# Patient Record
Sex: Female | Born: 1975 | Race: White | Hispanic: No | Marital: Married
Health system: Southern US, Community
[De-identification: ages and names within clinical notes are randomized; demographics above are authoritative.]

---

## 2005-01-27 ENCOUNTER — Emergency Department (HOSPITAL_COMMUNITY): Admission: EM | Admit: 2005-01-27 | Discharge: 2005-01-27 | Payer: Self-pay | Admitting: Emergency Medicine

## 2007-05-09 ENCOUNTER — Encounter: Admission: RE | Admit: 2007-05-09 | Discharge: 2007-05-09 | Payer: Self-pay | Admitting: Obstetrics and Gynecology

## 2007-07-21 ENCOUNTER — Inpatient Hospital Stay (HOSPITAL_COMMUNITY): Admission: AD | Admit: 2007-07-21 | Discharge: 2007-07-23 | Payer: Self-pay | Admitting: Obstetrics and Gynecology

## 2009-07-28 ENCOUNTER — Ambulatory Visit (HOSPITAL_COMMUNITY): Admission: RE | Admit: 2009-07-28 | Discharge: 2009-07-28 | Payer: Self-pay | Admitting: *Deleted

## 2009-08-25 ENCOUNTER — Ambulatory Visit (HOSPITAL_COMMUNITY): Admission: RE | Admit: 2009-08-25 | Discharge: 2009-08-25 | Payer: Self-pay | Admitting: Obstetrics and Gynecology

## 2009-09-22 ENCOUNTER — Ambulatory Visit (HOSPITAL_COMMUNITY): Admission: RE | Admit: 2009-09-22 | Discharge: 2009-09-22 | Payer: Self-pay | Admitting: Obstetrics and Gynecology

## 2009-12-02 ENCOUNTER — Inpatient Hospital Stay (HOSPITAL_COMMUNITY): Admission: AD | Admit: 2009-12-02 | Discharge: 2009-12-03 | Payer: Self-pay | Admitting: Obstetrics and Gynecology

## 2011-01-24 LAB — CBC
HCT: 32.8 % — ABNORMAL LOW (ref 36.0–46.0)
MCHC: 34.2 g/dL (ref 30.0–36.0)
MCV: 91.1 fL (ref 78.0–100.0)
Platelets: 186 10*3/uL (ref 150–400)
RBC: 3.6 MIL/uL — ABNORMAL LOW (ref 3.87–5.11)

## 2011-08-20 LAB — CBC
Hemoglobin: 12.4
MCV: 84.6
MCV: 84.8
WBC: 11.7 — ABNORMAL HIGH
WBC: 13.9 — ABNORMAL HIGH

## 2011-08-20 LAB — CCBB MATERNAL DONOR DRAW

## 2011-08-20 LAB — RAPID HIV SCREEN (WH-MAU): Rapid HIV Screen: NONREACTIVE

## 2016-03-22 DIAGNOSIS — Z1231 Encounter for screening mammogram for malignant neoplasm of breast: Secondary | ICD-10-CM | POA: Diagnosis not present

## 2016-04-30 DIAGNOSIS — G5601 Carpal tunnel syndrome, right upper limb: Secondary | ICD-10-CM | POA: Diagnosis not present

## 2016-05-26 DIAGNOSIS — G5601 Carpal tunnel syndrome, right upper limb: Secondary | ICD-10-CM | POA: Diagnosis not present

## 2016-06-08 DIAGNOSIS — G5601 Carpal tunnel syndrome, right upper limb: Secondary | ICD-10-CM | POA: Diagnosis not present

## 2016-06-08 DIAGNOSIS — Z4789 Encounter for other orthopedic aftercare: Secondary | ICD-10-CM | POA: Diagnosis not present

## 2016-06-08 DIAGNOSIS — Z9889 Other specified postprocedural states: Secondary | ICD-10-CM | POA: Diagnosis not present

## 2016-09-11 DIAGNOSIS — Z23 Encounter for immunization: Secondary | ICD-10-CM | POA: Diagnosis not present

## 2017-03-24 DIAGNOSIS — Z6825 Body mass index (BMI) 25.0-25.9, adult: Secondary | ICD-10-CM | POA: Diagnosis not present

## 2017-03-24 DIAGNOSIS — Z1231 Encounter for screening mammogram for malignant neoplasm of breast: Secondary | ICD-10-CM | POA: Diagnosis not present

## 2017-03-24 DIAGNOSIS — Z01419 Encounter for gynecological examination (general) (routine) without abnormal findings: Secondary | ICD-10-CM | POA: Diagnosis not present

## 2017-04-12 DIAGNOSIS — M65311 Trigger thumb, right thumb: Secondary | ICD-10-CM | POA: Diagnosis not present

## 2017-05-10 DIAGNOSIS — M65311 Trigger thumb, right thumb: Secondary | ICD-10-CM | POA: Diagnosis not present

## 2017-08-18 DIAGNOSIS — M65311 Trigger thumb, right thumb: Secondary | ICD-10-CM | POA: Diagnosis not present

## 2018-01-05 DIAGNOSIS — J069 Acute upper respiratory infection, unspecified: Secondary | ICD-10-CM | POA: Diagnosis not present

## 2018-08-14 DIAGNOSIS — Z23 Encounter for immunization: Secondary | ICD-10-CM | POA: Diagnosis not present

## 2018-08-17 DIAGNOSIS — Z01419 Encounter for gynecological examination (general) (routine) without abnormal findings: Secondary | ICD-10-CM | POA: Diagnosis not present

## 2018-08-17 DIAGNOSIS — R87810 Cervical high risk human papillomavirus (HPV) DNA test positive: Secondary | ICD-10-CM | POA: Diagnosis not present

## 2018-08-17 DIAGNOSIS — Z6825 Body mass index (BMI) 25.0-25.9, adult: Secondary | ICD-10-CM | POA: Diagnosis not present

## 2018-08-17 DIAGNOSIS — Z1231 Encounter for screening mammogram for malignant neoplasm of breast: Secondary | ICD-10-CM | POA: Diagnosis not present

## 2018-08-17 DIAGNOSIS — R87610 Atypical squamous cells of undetermined significance on cytologic smear of cervix (ASC-US): Secondary | ICD-10-CM | POA: Diagnosis not present

## 2018-12-10 DIAGNOSIS — R509 Fever, unspecified: Secondary | ICD-10-CM | POA: Diagnosis not present

## 2018-12-10 DIAGNOSIS — R05 Cough: Secondary | ICD-10-CM | POA: Diagnosis not present

## 2018-12-10 DIAGNOSIS — M791 Myalgia, unspecified site: Secondary | ICD-10-CM | POA: Diagnosis not present

## 2018-12-10 DIAGNOSIS — J101 Influenza due to other identified influenza virus with other respiratory manifestations: Secondary | ICD-10-CM | POA: Diagnosis not present

## 2019-08-07 DIAGNOSIS — F418 Other specified anxiety disorders: Secondary | ICD-10-CM | POA: Diagnosis not present

## 2019-08-08 DIAGNOSIS — Z23 Encounter for immunization: Secondary | ICD-10-CM | POA: Diagnosis not present

## 2019-10-10 DIAGNOSIS — Z3040 Encounter for surveillance of contraceptives, unspecified: Secondary | ICD-10-CM | POA: Diagnosis not present

## 2019-10-10 DIAGNOSIS — Z6825 Body mass index (BMI) 25.0-25.9, adult: Secondary | ICD-10-CM | POA: Diagnosis not present

## 2019-10-10 DIAGNOSIS — Z01419 Encounter for gynecological examination (general) (routine) without abnormal findings: Secondary | ICD-10-CM | POA: Diagnosis not present

## 2019-10-10 DIAGNOSIS — Z1231 Encounter for screening mammogram for malignant neoplasm of breast: Secondary | ICD-10-CM | POA: Diagnosis not present

## 2019-10-11 DIAGNOSIS — Z01419 Encounter for gynecological examination (general) (routine) without abnormal findings: Secondary | ICD-10-CM | POA: Diagnosis not present

## 2019-10-18 DIAGNOSIS — Z20828 Contact with and (suspected) exposure to other viral communicable diseases: Secondary | ICD-10-CM | POA: Diagnosis not present

## 2020-01-05 ENCOUNTER — Ambulatory Visit: Payer: Self-pay | Attending: Internal Medicine

## 2020-01-05 DIAGNOSIS — Z23 Encounter for immunization: Secondary | ICD-10-CM | POA: Insufficient documentation

## 2020-01-05 NOTE — Progress Notes (Signed)
   Covid-19 Vaccination [facility]  Name:  AREYA LEMMERMAN    MRN: [id number] DOB: [birth year]  01/05/2020  Ms. Wallman was observed post Covid-19 immunization for 15 minutes without incidence. She was provided with Vaccine Information Sheet and instruction to access the V-Safe system.   Ms. Loomis was instructed to call 911 with any severe reactions post vaccine: Marland Kitchen Difficulty breathing  . Swelling of your face and throat  . A fast heartbeat  . A bad rash all over your body  . Dizziness and weakness    Immunizations Administered    Name Date Dose VIS Date Route   Pfizer COVID-19 Vaccine 01/05/2020 11:12 AM 0.3 mL 10/19/2019 Intramuscular   Manufacturer: [employer], [employer]   Lot: [id number]   NDC: [address]-6

## 2020-01-30 ENCOUNTER — Ambulatory Visit: Payer: Self-pay | Attending: Internal Medicine

## 2020-01-30 DIAGNOSIS — Z23 Encounter for immunization: Secondary | ICD-10-CM

## 2020-01-30 NOTE — Progress Notes (Signed)
   Covid-19 Vaccination Clinic  Name:  Robin Beltran    MRN: [id number] DOB: May 03, 1976  01/30/2020  [name] was observed post Covid-19 immunization for 15 minutes without incident. She was provided with Vaccine Information Sheet and instruction to access the V-Safe system.   [name] was instructed to call 911 with any severe reactions post vaccine: Marland Kitchen Difficulty breathing  . Swelling of face and throat  . A fast heartbeat  . A bad rash all over body  . Dizziness and weakness   Immunizations Administered    Name Date Dose VIS Date Route   Pfizer COVID-19 Vaccine 01/30/2020 11:33 AM 0.3 mL 10/19/2019 Intramuscular   Manufacturer: [employer], [employer]   Lot: [id number]   NDC: [address]-0

## 2020-07-22 DIAGNOSIS — J029 Acute pharyngitis, unspecified: Secondary | ICD-10-CM | POA: Diagnosis not present

## 2020-08-07 DIAGNOSIS — Z23 Encounter for immunization: Secondary | ICD-10-CM | POA: Diagnosis not present

## 2020-11-26 DIAGNOSIS — Z1231 Encounter for screening mammogram for malignant neoplasm of breast: Secondary | ICD-10-CM | POA: Diagnosis not present

## 2020-12-03 DIAGNOSIS — Z01419 Encounter for gynecological examination (general) (routine) without abnormal findings: Secondary | ICD-10-CM | POA: Diagnosis not present

## 2020-12-03 DIAGNOSIS — Z6827 Body mass index (BMI) 27.0-27.9, adult: Secondary | ICD-10-CM | POA: Diagnosis not present

## 2021-08-19 ENCOUNTER — Other Ambulatory Visit: Payer: Self-pay | Admitting: Obstetrics and Gynecology

## 2021-08-19 DIAGNOSIS — Z803 Family history of malignant neoplasm of breast: Secondary | ICD-10-CM

## 2021-09-02 ENCOUNTER — Ambulatory Visit
Admission: RE | Admit: 2021-09-02 | Discharge: 2021-09-02 | Disposition: A | Discharge status: Home / Self Care | Payer: BC Managed Care – PPO | Source: Ambulatory Visit | Attending: Obstetrics and Gynecology | Admitting: Obstetrics and Gynecology

## 2021-09-02 DIAGNOSIS — N6489 Other specified disorders of breast: Secondary | ICD-10-CM | POA: Diagnosis not present

## 2021-09-02 DIAGNOSIS — Z803 Family history of malignant neoplasm of breast: Secondary | ICD-10-CM

## 2021-09-02 MED ORDER — GADOBUTROL 1 MMOL/ML IV SOLN
7.0000 mL | Freq: Once | INTRAVENOUS | Status: AC | PRN
  Administered 2021-09-02: 7 mL via INTRAVENOUS

## 2022-01-29 ENCOUNTER — Other Ambulatory Visit: Payer: Self-pay | Admitting: Obstetrics and Gynecology

## 2022-01-29 DIAGNOSIS — Z9189 Other specified personal risk factors, not elsewhere classified: Secondary | ICD-10-CM

## 2022-08-02 ENCOUNTER — Ambulatory Visit
Admission: RE | Admit: 2022-08-02 | Discharge: 2022-08-02 | Disposition: A | Discharge status: Home / Self Care | Payer: BC Managed Care – PPO | Source: Ambulatory Visit | Attending: Obstetrics and Gynecology | Admitting: Obstetrics and Gynecology

## 2022-08-02 DIAGNOSIS — Z9189 Other specified personal risk factors, not elsewhere classified: Secondary | ICD-10-CM

## 2022-08-02 DIAGNOSIS — Z1239 Encounter for other screening for malignant neoplasm of breast: Secondary | ICD-10-CM | POA: Diagnosis not present

## 2022-08-02 MED ORDER — GADOBUTROL 1 MMOL/ML IV SOLN
8.0000 mL | Freq: Once | INTRAVENOUS | Status: AC | PRN
  Administered 2022-08-02: 8 mL via INTRAVENOUS

## 2022-11-30 IMAGING — MR MR BREAST BILAT WO/W CM
8 of 12 series · 33 of 48 positions shown · IV contrast (gadavist)
Comparison: Previous exam(s).

CLINICAL DATA: High risk screening. Family history of breast
carcinoma, diagnosed in her sister at age 46.

LABS:  No labs drawn at time of imaging.
EXAM:
BILATERAL BREAST MRI WITH AND WITHOUT CONTRAST
TECHNIQUE: Multiplanar, multisequence MR images of both breasts were obtained
prior to and following the intravenous administration of 7 ml of
Gadavist

[Series 2: t2_tirm_tra ipat (a-p) · axial · 3.0mm · 0.70mm/px · 1 of 55 slices shown]
[im 1/55]
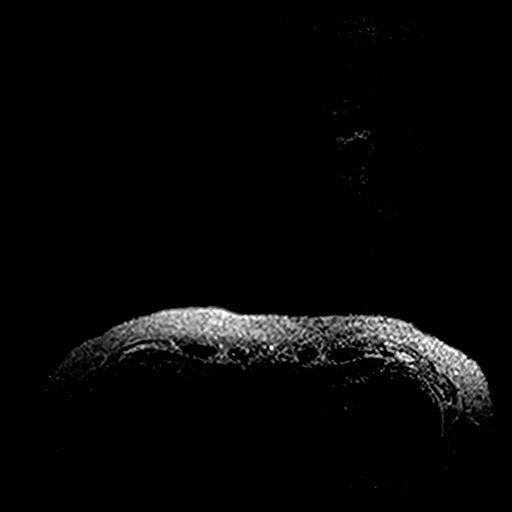

[Series 3: fl3d pre-cm no · axial · non-contrast · 1.2mm · 0.89mm/px · z∈[-60,+112]mm · 5 of 144 slices shown]
[im 1/144]
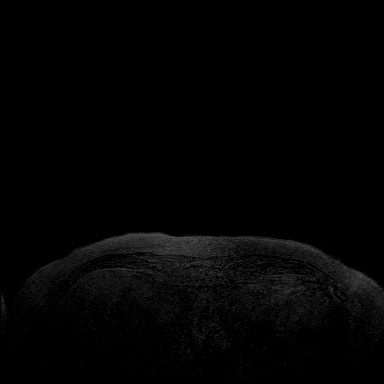
[im 36/144]
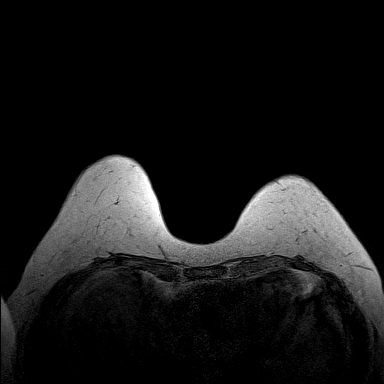
[im 72/144]
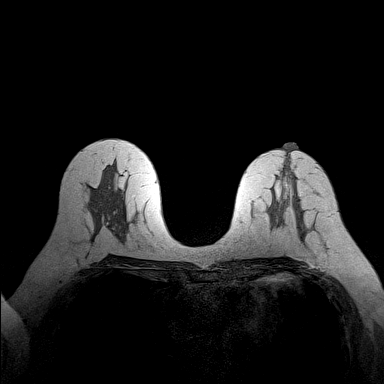
[im 108/144]
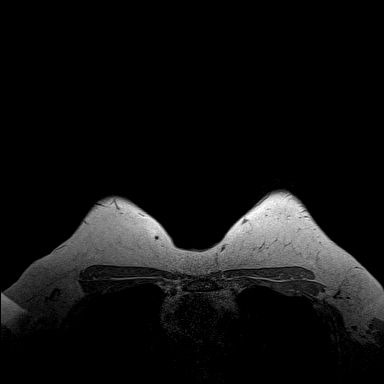
[im 144/144]
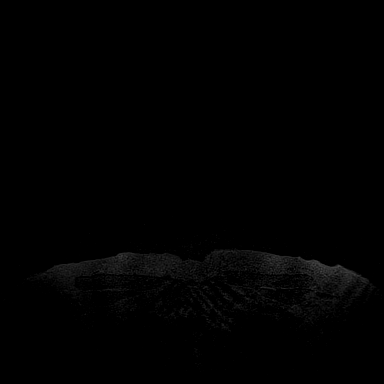

[Series 4: fl3d pre-cm · axial · non-contrast · 1.2mm · 0.89mm/px · z∈[-60,+112]mm · 5 of 144 slices shown]
[im 1/144]
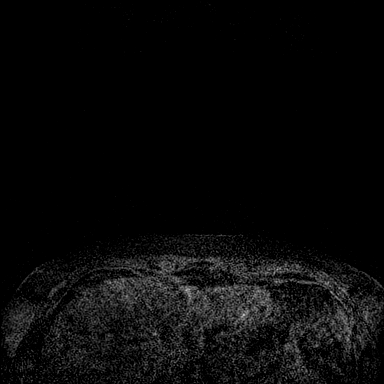
[im 36/144]
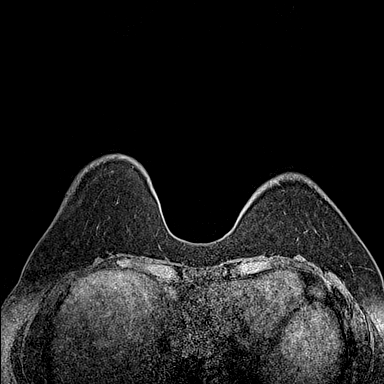
[im 72/144]
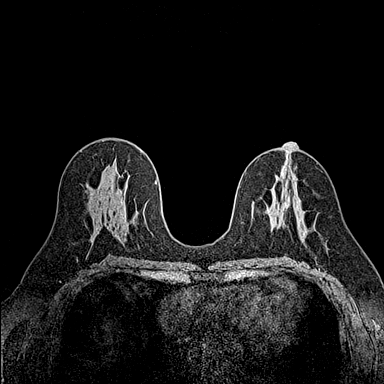
[im 108/144]
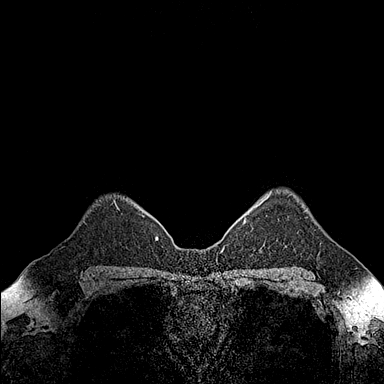
[im 144/144]
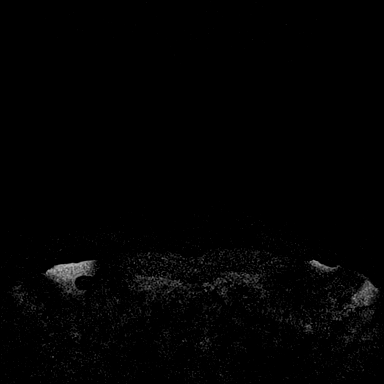

[Series 5: fl3d post-cm 20 · axial · 1.2mm · 0.89mm/px · z∈[-60,+112]mm · 5 of 144 slices shown (1 of 3)]
[im 1/144]
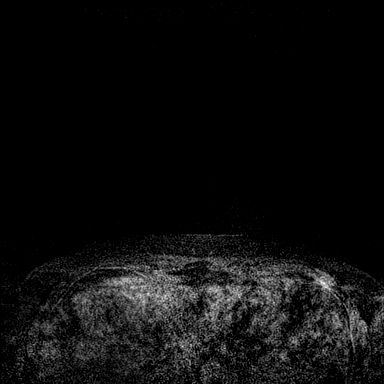
[im 36/144]
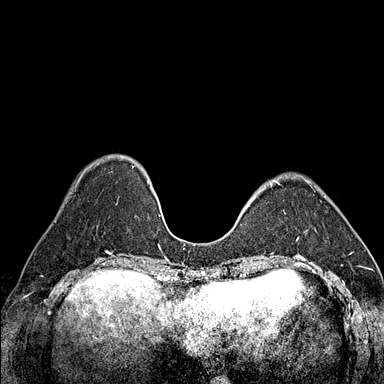
[im 72/144]
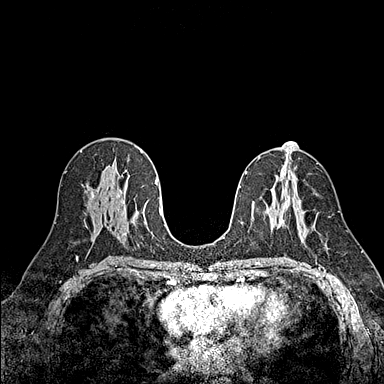
[im 108/144]
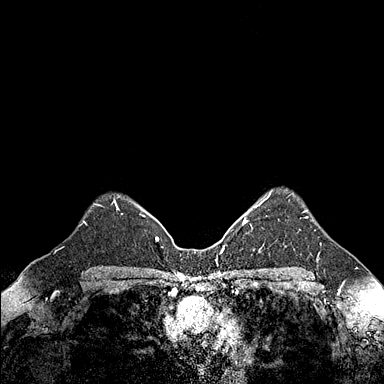
[im 144/144]
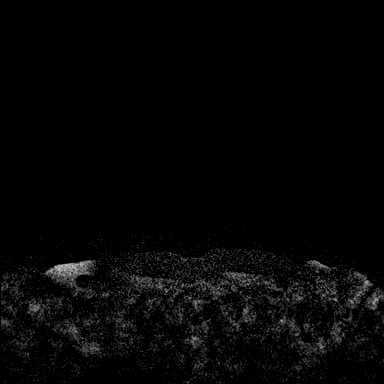

[Series 6: fl3d post-cm 20 · axial · 1.2mm · 0.89mm/px · z∈[-60,+112]mm · 5 of 144 slices shown (2 of 3)]
[im 1/144]
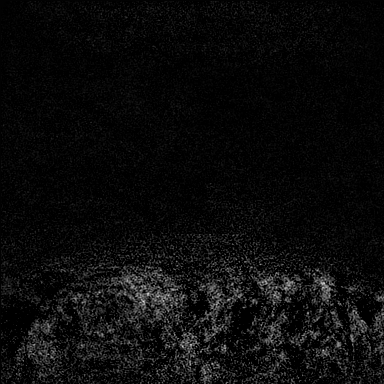
[im 36/144]
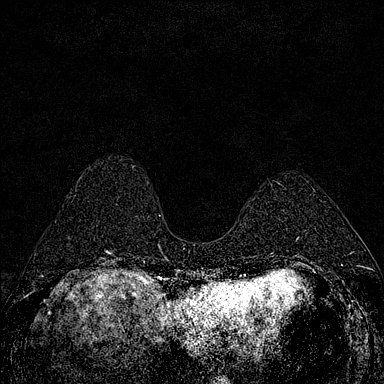
[im 72/144]
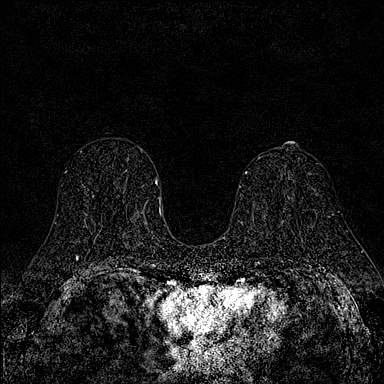
[im 108/144]
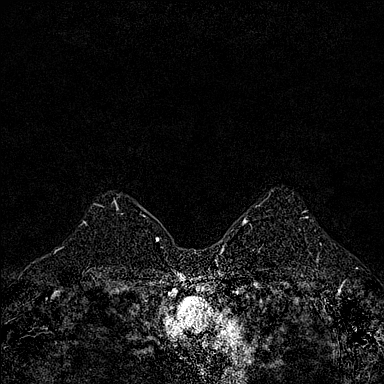
[im 144/144]
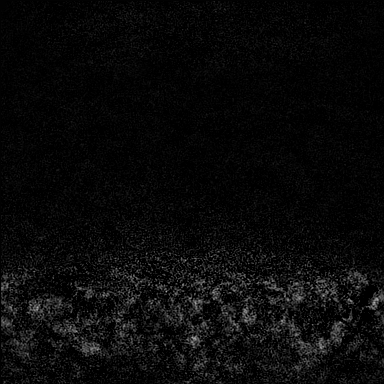

[Series 7: fl3d post-cm 20 · axial · 172.8mm · 0.89mm/px · 1 of 1 slices shown (3 of 3)]
[im 1/1]
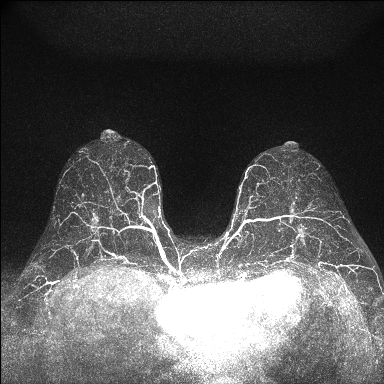

[Series 8: fl3d post-cm 3 · axial · 1.2mm · 0.89mm/px · z∈[-60,+112]mm · 6 of 144 slices shown (1 of 2)]
[im 1/144]
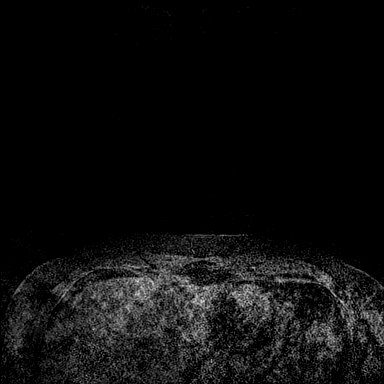
[im 29/144]
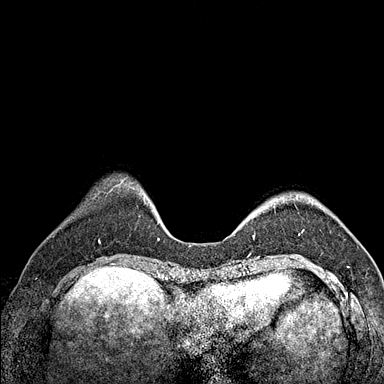
[im 58/144]
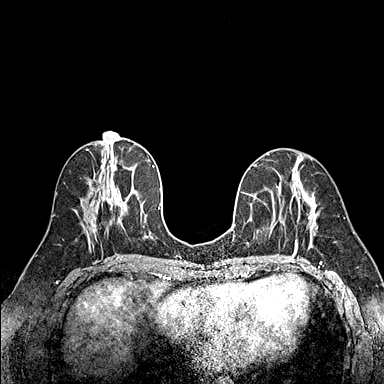
[im 86/144]
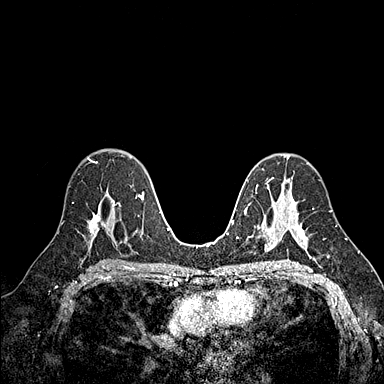
[im 115/144]
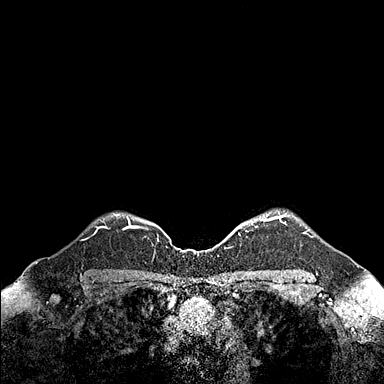
[im 144/144]
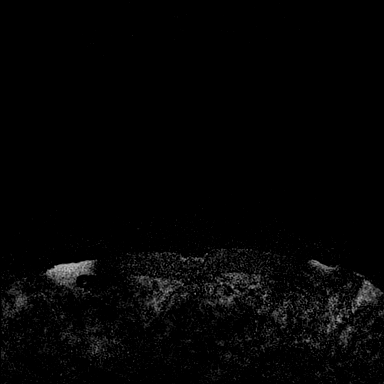

[Series 9: fl3d post-cm 3 · axial · 1.2mm · 0.89mm/px · z∈[-60,+77]mm · 5 of 144 slices shown (2 of 2)]
[im 1/144]
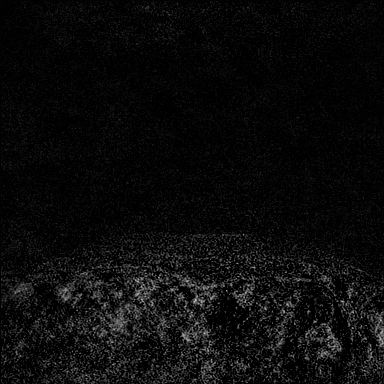
[im 29/144]
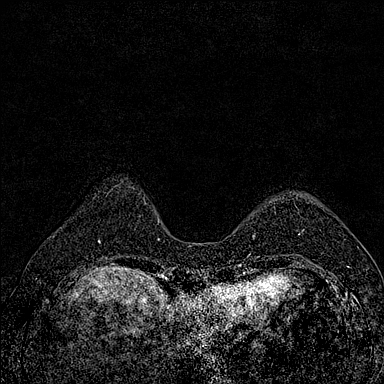
[im 58/144]
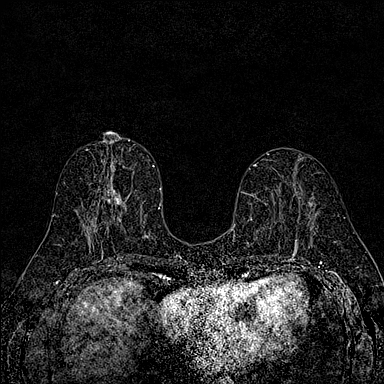
[im 86/144]
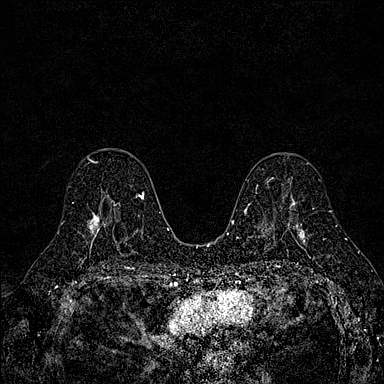
[im 115/144]
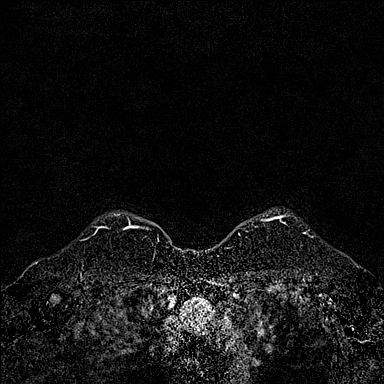

[33 of 48 positions shown; findings below may reference images not displayed]

Three-dimensional MR images were rendered by post-processing of the
original MR data on an independent workstation. The
three-dimensional MR images were interpreted, and findings are
reported in the following complete MRI report for this study. Three
dimensional images were evaluated at the independent interpreting
workstation using the DynaCAD thin client.
FINDINGS: Breast composition: c. Heterogeneous fibroglandular tissue.

Background parenchymal enhancement: Mild

Right breast: No mass or abnormal enhancement.

Left breast: No mass or abnormal enhancement.

Lymph nodes: No abnormal appearing lymph nodes.

Ancillary findings:  None.
IMPRESSION: No MRI evidence of breast malignancy.

RECOMMENDATION:
1. Annual screening mammography.
2. Annual supplemental high risk screening breast MRI should be
considered if this patient has a greater than 20% lifetime risk for
developing breast carcinoma. This recommendation is based on the
ACR/ACS guidelines.

BI-RADS CATEGORY  1: Negative.

## 2022-12-29 DIAGNOSIS — Z1211 Encounter for screening for malignant neoplasm of colon: Secondary | ICD-10-CM | POA: Diagnosis not present

## 2023-01-28 DIAGNOSIS — Q438 Other specified congenital malformations of intestine: Secondary | ICD-10-CM | POA: Diagnosis not present

## 2023-01-28 DIAGNOSIS — Z1211 Encounter for screening for malignant neoplasm of colon: Secondary | ICD-10-CM | POA: Diagnosis not present

## 2023-01-28 DIAGNOSIS — K648 Other hemorrhoids: Secondary | ICD-10-CM | POA: Diagnosis not present

## 2023-01-28 DIAGNOSIS — K644 Residual hemorrhoidal skin tags: Secondary | ICD-10-CM | POA: Diagnosis not present

## 2023-03-16 DIAGNOSIS — Z1231 Encounter for screening mammogram for malignant neoplasm of breast: Secondary | ICD-10-CM | POA: Diagnosis not present

## 2023-03-16 DIAGNOSIS — Z6827 Body mass index (BMI) 27.0-27.9, adult: Secondary | ICD-10-CM | POA: Diagnosis not present

## 2023-03-16 DIAGNOSIS — Z01419 Encounter for gynecological examination (general) (routine) without abnormal findings: Secondary | ICD-10-CM | POA: Diagnosis not present

## 2023-03-16 DIAGNOSIS — R8279 Other abnormal findings on microbiological examination of urine: Secondary | ICD-10-CM | POA: Diagnosis not present

## 2023-03-21 ENCOUNTER — Other Ambulatory Visit: Payer: Self-pay | Admitting: Obstetrics and Gynecology

## 2023-03-21 DIAGNOSIS — R928 Other abnormal and inconclusive findings on diagnostic imaging of breast: Secondary | ICD-10-CM

## 2023-03-30 ENCOUNTER — Ambulatory Visit: Payer: BC Managed Care – PPO

## 2023-03-30 ENCOUNTER — Ambulatory Visit
Admission: RE | Admit: 2023-03-30 | Discharge: 2023-03-30 | Disposition: A | Discharge status: Home / Self Care | Payer: BC Managed Care – PPO | Source: Ambulatory Visit | Attending: Obstetrics and Gynecology | Admitting: Obstetrics and Gynecology

## 2023-03-30 DIAGNOSIS — R922 Inconclusive mammogram: Secondary | ICD-10-CM | POA: Diagnosis not present

## 2023-03-30 DIAGNOSIS — R928 Other abnormal and inconclusive findings on diagnostic imaging of breast: Secondary | ICD-10-CM

## 2024-05-09 DIAGNOSIS — Z01419 Encounter for gynecological examination (general) (routine) without abnormal findings: Secondary | ICD-10-CM | POA: Diagnosis not present

## 2024-05-09 DIAGNOSIS — Z1231 Encounter for screening mammogram for malignant neoplasm of breast: Secondary | ICD-10-CM | POA: Diagnosis not present

## 2024-05-09 DIAGNOSIS — Z6828 Body mass index (BMI) 28.0-28.9, adult: Secondary | ICD-10-CM | POA: Diagnosis not present

## 2024-05-13 DIAGNOSIS — R030 Elevated blood-pressure reading, without diagnosis of hypertension: Secondary | ICD-10-CM | POA: Diagnosis not present

## 2024-05-13 DIAGNOSIS — S70362A Insect bite (nonvenomous), left thigh, initial encounter: Secondary | ICD-10-CM | POA: Diagnosis not present

## 2024-06-03 DIAGNOSIS — L03119 Cellulitis of unspecified part of limb: Secondary | ICD-10-CM | POA: Diagnosis not present

## 2024-06-03 DIAGNOSIS — I10 Essential (primary) hypertension: Secondary | ICD-10-CM | POA: Diagnosis not present

## 2024-06-03 DIAGNOSIS — L309 Dermatitis, unspecified: Secondary | ICD-10-CM | POA: Diagnosis not present

## 2024-06-22 DIAGNOSIS — I10 Essential (primary) hypertension: Secondary | ICD-10-CM | POA: Diagnosis not present

## 2024-06-22 DIAGNOSIS — Z6827 Body mass index (BMI) 27.0-27.9, adult: Secondary | ICD-10-CM | POA: Diagnosis not present

## 2024-06-22 DIAGNOSIS — L309 Dermatitis, unspecified: Secondary | ICD-10-CM | POA: Diagnosis not present

## 2024-07-10 DIAGNOSIS — L309 Dermatitis, unspecified: Secondary | ICD-10-CM | POA: Diagnosis not present

## 2024-07-10 DIAGNOSIS — I10 Essential (primary) hypertension: Secondary | ICD-10-CM | POA: Diagnosis not present

## 2024-08-21 DIAGNOSIS — Z23 Encounter for immunization: Secondary | ICD-10-CM | POA: Diagnosis not present

## 2024-08-30 DIAGNOSIS — Z6826 Body mass index (BMI) 26.0-26.9, adult: Secondary | ICD-10-CM | POA: Diagnosis not present

## 2024-08-30 DIAGNOSIS — I10 Essential (primary) hypertension: Secondary | ICD-10-CM | POA: Diagnosis not present

## 2024-08-30 DIAGNOSIS — L309 Dermatitis, unspecified: Secondary | ICD-10-CM | POA: Diagnosis not present
